# Patient Record
Sex: Female | Born: 2000 | Race: White | Hispanic: No | Marital: Single | Smoking: Never smoker
Health system: Southern US, Community
[De-identification: ages and names within clinical notes are randomized; demographics above are authoritative.]

## PROBLEM LIST (undated history)

## (undated) DIAGNOSIS — N83209 Unspecified ovarian cyst, unspecified side: Secondary | ICD-10-CM

## (undated) HISTORY — DX: Unspecified ovarian cyst, unspecified side: N83.209

---
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
## 2018-06-10 DIAGNOSIS — Z68.41 Body mass index (BMI) pediatric, 5th percentile to less than 85th percentile for age: Secondary | ICD-10-CM | POA: Diagnosis not present

## 2018-06-10 DIAGNOSIS — J029 Acute pharyngitis, unspecified: Secondary | ICD-10-CM | POA: Diagnosis not present

## 2018-06-10 DIAGNOSIS — J329 Chronic sinusitis, unspecified: Secondary | ICD-10-CM | POA: Diagnosis not present

## 2018-06-10 DIAGNOSIS — Z7689 Persons encountering health services in other specified circumstances: Secondary | ICD-10-CM | POA: Diagnosis not present

## 2018-06-28 DIAGNOSIS — Z0000 Encounter for general adult medical examination without abnormal findings: Secondary | ICD-10-CM | POA: Diagnosis not present

## 2018-06-28 DIAGNOSIS — J329 Chronic sinusitis, unspecified: Secondary | ICD-10-CM | POA: Diagnosis not present

## 2018-06-28 DIAGNOSIS — Z68.41 Body mass index (BMI) pediatric, 5th percentile to less than 85th percentile for age: Secondary | ICD-10-CM | POA: Diagnosis not present

## 2018-09-12 DIAGNOSIS — Z03818 Encounter for observation for suspected exposure to other biological agents ruled out: Secondary | ICD-10-CM | POA: Diagnosis not present

## 2018-09-12 DIAGNOSIS — J069 Acute upper respiratory infection, unspecified: Secondary | ICD-10-CM | POA: Diagnosis not present

## 2018-09-26 DIAGNOSIS — N760 Acute vaginitis: Secondary | ICD-10-CM | POA: Diagnosis not present

## 2018-11-28 DIAGNOSIS — N3000 Acute cystitis without hematuria: Secondary | ICD-10-CM | POA: Diagnosis not present

## 2018-12-31 DIAGNOSIS — J029 Acute pharyngitis, unspecified: Secondary | ICD-10-CM | POA: Diagnosis not present

## 2019-02-03 DIAGNOSIS — Z20828 Contact with and (suspected) exposure to other viral communicable diseases: Secondary | ICD-10-CM | POA: Diagnosis not present

## 2019-03-25 DIAGNOSIS — Z20828 Contact with and (suspected) exposure to other viral communicable diseases: Secondary | ICD-10-CM | POA: Diagnosis not present

## 2019-03-25 DIAGNOSIS — J029 Acute pharyngitis, unspecified: Secondary | ICD-10-CM | POA: Diagnosis not present

## 2019-04-23 DIAGNOSIS — Z1159 Encounter for screening for other viral diseases: Secondary | ICD-10-CM | POA: Diagnosis not present
